# Patient Record
Sex: Female | Born: 1990 | Race: Black or African American | Hispanic: No | Marital: Single
Health system: Southern US, Community
[De-identification: ages and names within clinical notes are randomized; demographics above are authoritative.]

---

## 2011-11-17 ENCOUNTER — Emergency Department: Payer: Self-pay | Admitting: Emergency Medicine

## 2012-10-21 ENCOUNTER — Ambulatory Visit: Payer: Self-pay | Admitting: Obstetrics and Gynecology

## 2012-10-21 LAB — CBC
HCT: 36.3 % (ref 35.0–47.0)
HGB: 12.4 g/dL (ref 12.0–16.0)
MCH: 28.3 pg (ref 26.0–34.0)
MCHC: 34.2 g/dL (ref 32.0–36.0)
MCV: 83 fL (ref 80–100)
Platelet: 210 10*3/uL (ref 150–440)
RBC: 4.38 10*6/uL (ref 3.80–5.20)
RDW: 12.3 % (ref 11.5–14.5)
WBC: 15.7 10*3/uL — ABNORMAL HIGH (ref 3.6–11.0)

## 2012-10-21 LAB — BASIC METABOLIC PANEL
Anion Gap: 6 — ABNORMAL LOW (ref 7–16)
EGFR (African American): >60
Osmolality: 267 (ref 275–301)
Potassium: 3.5 mmol/L (ref 3.5–5.1)
Sodium: 135 mmol/L — ABNORMAL LOW (ref 136–145)

## 2012-10-21 LAB — HCG, QUANTITATIVE, PREGNANCY: Beta Hcg, Quant.: 44561 m[IU]/mL — ABNORMAL HIGH

## 2012-10-23 LAB — PATHOLOGY REPORT

## 2013-05-04 ENCOUNTER — Emergency Department: Payer: Self-pay | Admitting: Emergency Medicine

## 2013-05-04 LAB — CBC
HGB: 13.1 g/dL (ref 12.0–16.0)
MCH: 28.7 pg (ref 26.0–34.0)
MCV: 83 fL (ref 80–100)
RBC: 4.55 10*6/uL (ref 3.80–5.20)
WBC: 10.2 10*3/uL (ref 3.6–11.0)

## 2013-05-04 LAB — COMPREHENSIVE METABOLIC PANEL
Alkaline Phosphatase: 87 U/L (ref 50–136)
BUN: 14 mg/dL (ref 7–18)
Calcium, Total: 8.8 mg/dL (ref 8.5–10.1)
Creatinine: 0.96 mg/dL (ref 0.60–1.30)
EGFR (African American): >60
Glucose: 80 mg/dL (ref 65–99)
Potassium: 4.1 mmol/L (ref 3.5–5.1)
SGOT(AST): 18 U/L (ref 15–37)
SGPT (ALT): 19 U/L (ref 12–78)
Total Protein: 7.1 g/dL (ref 6.4–8.2)

## 2013-05-04 LAB — URINALYSIS, COMPLETE
Bacteria: NONE SEEN
Bilirubin,UR: NEGATIVE
Ketone: NEGATIVE
Leukocyte Esterase: NEGATIVE
Nitrite: NEGATIVE
RBC,UR: NONE SEEN /HPF (ref 0–5)
Specific Gravity: 1.016 (ref 1.003–1.030)
Squamous Epithelial: <1
WBC UR: NONE SEEN /HPF (ref 0–5)

## 2013-05-04 LAB — HCG, QUANTITATIVE, PREGNANCY: Beta Hcg, Quant.: 28874 m[IU]/mL — ABNORMAL HIGH

## 2013-08-19 ENCOUNTER — Observation Stay: Payer: Self-pay

## 2013-08-20 ENCOUNTER — Inpatient Hospital Stay: Payer: Self-pay | Admitting: Obstetrics and Gynecology

## 2013-08-20 LAB — COMPREHENSIVE METABOLIC PANEL
Albumin: 2.1 g/dL — ABNORMAL LOW (ref 3.4–5.0)
Alkaline Phosphatase: 58 U/L
Anion Gap: 9 (ref 7–16)
BUN: 4 mg/dL — AB (ref 7–18)
Bilirubin,Total: 0.1 mg/dL — ABNORMAL LOW (ref 0.2–1.0)
CREATININE: 0.47 mg/dL — AB (ref 0.60–1.30)
Calcium, Total: 7.9 mg/dL — ABNORMAL LOW (ref 8.5–10.1)
Chloride: 105 mmol/L (ref 98–107)
Co2: 22 mmol/L (ref 21–32)
EGFR (Non-African Amer.): >60
Glucose: 58 mg/dL — ABNORMAL LOW (ref 65–99)
OSMOLALITY: 267 (ref 275–301)
Potassium: 3.8 mmol/L (ref 3.5–5.1)
SGOT(AST): 15 U/L (ref 15–37)
SGPT (ALT): 13 U/L (ref 12–78)
SODIUM: 136 mmol/L (ref 136–145)
TOTAL PROTEIN: 4.7 g/dL — AB (ref 6.4–8.2)

## 2013-08-20 LAB — CBC
HCT: 24.4 % — AB (ref 35.0–47.0)
HGB: 8.4 g/dL — AB (ref 12.0–16.0)
MCH: 29 pg (ref 26.0–34.0)
MCHC: 34.5 g/dL (ref 32.0–36.0)
MCV: 84 fL (ref 80–100)
Platelet: 154 10*3/uL (ref 150–440)
RBC: 2.9 10*6/uL — ABNORMAL LOW (ref 3.80–5.20)
RDW: 13.6 % (ref 11.5–14.5)
WBC: 9.5 10*3/uL (ref 3.6–11.0)

## 2013-08-21 LAB — CBC WITH DIFFERENTIAL/PLATELET
BASOS ABS: 0 10*3/uL (ref 0.0–0.1)
Basophil %: 0.1 %
Eosinophil #: 0 10*3/uL (ref 0.0–0.7)
Eosinophil %: 0.1 %
HCT: 29.9 % — AB (ref 35.0–47.0)
HGB: 10.3 g/dL — ABNORMAL LOW (ref 12.0–16.0)
Lymphocyte #: 1.2 10*3/uL (ref 1.0–3.6)
Lymphocyte %: 6.5 %
MCH: 28.5 pg (ref 26.0–34.0)
MCHC: 34.5 g/dL (ref 32.0–36.0)
MCV: 83 fL (ref 80–100)
MONO ABS: 1.3 x10 3/mm — AB (ref 0.2–0.9)
Monocyte %: 7.2 %
NEUTROS ABS: 15.9 10*3/uL — AB (ref 1.4–6.5)
Neutrophil %: 86.1 %
Platelet: 195 10*3/uL (ref 150–440)
RBC: 3.61 10*6/uL — ABNORMAL LOW (ref 3.80–5.20)
RDW: 13.2 % (ref 11.5–14.5)
WBC: 18.5 10*3/uL — ABNORMAL HIGH (ref 3.6–11.0)

## 2013-08-22 ENCOUNTER — Ambulatory Visit (HOSPITAL_COMMUNITY)
Admission: AD | Admit: 2013-08-22 | Discharge: 2013-08-22 | Disposition: A | Discharge status: Home / Self Care | Payer: Medicaid Other | Source: Other Acute Inpatient Hospital | Attending: Obstetrics and Gynecology | Admitting: Obstetrics and Gynecology

## 2013-08-22 DIAGNOSIS — O2690 Pregnancy related conditions, unspecified, unspecified trimester: Secondary | ICD-10-CM | POA: Diagnosis present

## 2013-08-22 LAB — CBC WITH DIFFERENTIAL/PLATELET
BASOS PCT: 0.1 %
Basophil #: 0 10*3/uL (ref 0.0–0.1)
EOS ABS: 0 10*3/uL (ref 0.0–0.7)
Eosinophil %: 0.4 %
HCT: 27.4 % — ABNORMAL LOW (ref 35.0–47.0)
HGB: 9.5 g/dL — ABNORMAL LOW (ref 12.0–16.0)
LYMPHS ABS: 2 10*3/uL (ref 1.0–3.6)
Lymphocyte %: 15 %
MCH: 28.8 pg (ref 26.0–34.0)
MCHC: 34.5 g/dL (ref 32.0–36.0)
MCV: 83 fL (ref 80–100)
Monocyte #: 1.3 x10 3/mm — ABNORMAL HIGH (ref 0.2–0.9)
Monocyte %: 9.5 %
NEUTROS PCT: 75 %
Neutrophil #: 10.2 10*3/uL — ABNORMAL HIGH (ref 1.4–6.5)
PLATELETS: 178 10*3/uL (ref 150–440)
RBC: 3.29 10*6/uL — ABNORMAL LOW (ref 3.80–5.20)
RDW: 13.5 % (ref 11.5–14.5)
WBC: 13.6 10*3/uL — ABNORMAL HIGH (ref 3.6–11.0)

## 2013-08-22 LAB — MAGNESIUM: Magnesium: 4.8 mg/dL — ABNORMAL HIGH

## 2014-06-07 IMAGING — US US OB < 14 WEEKS - US OB TV
1 series · 13 of 25 positions shown · non-contrast
Comparison: none

REASON FOR EXAM: 7 weeks preg - bleeding, pain - eval for IUP
COMMENTS:

[Series 1: us ob < 14 weeks - us ob tv · 0.25mm/px · 13 of 25 slices shown]
[im 1/25]
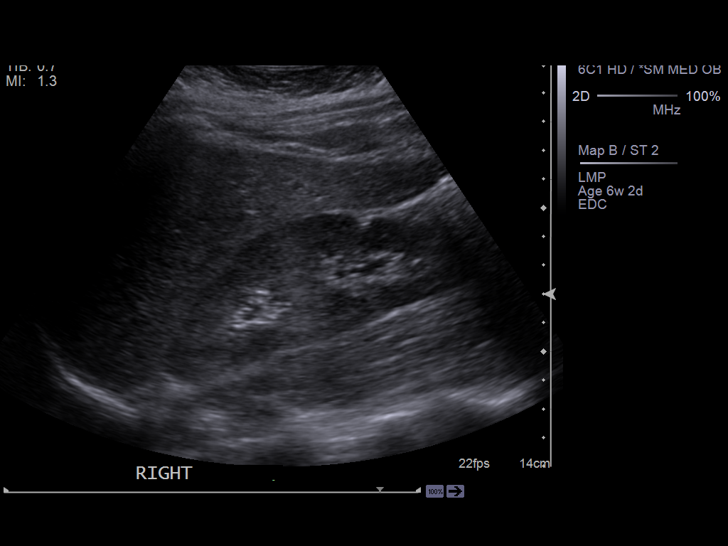
[im 3/25]
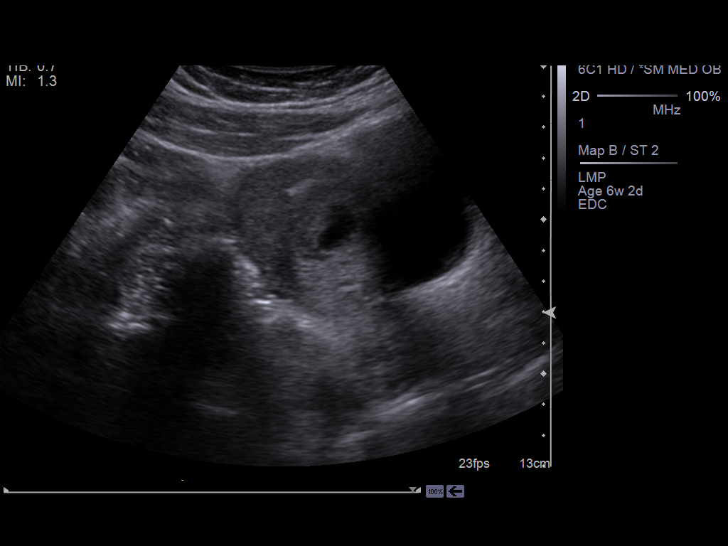
[im 5/25]
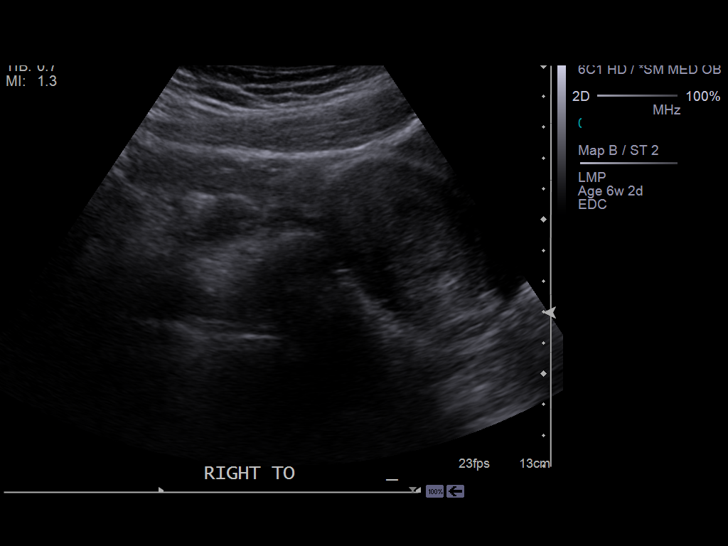
[im 7/25]
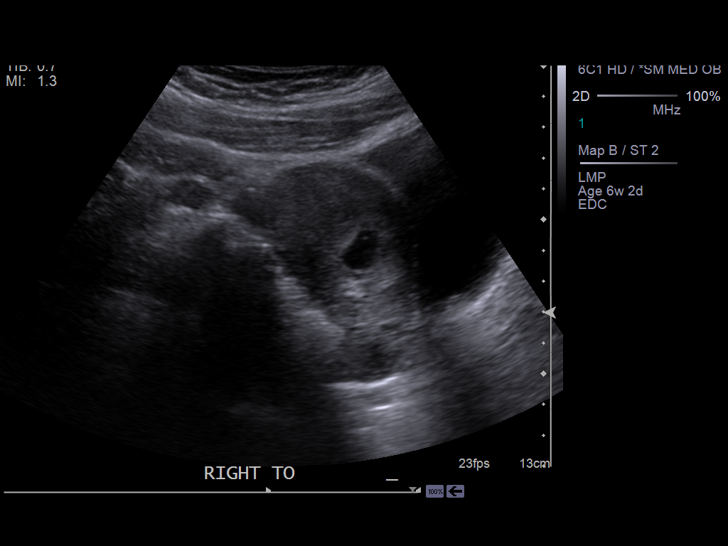
[im 9/25]
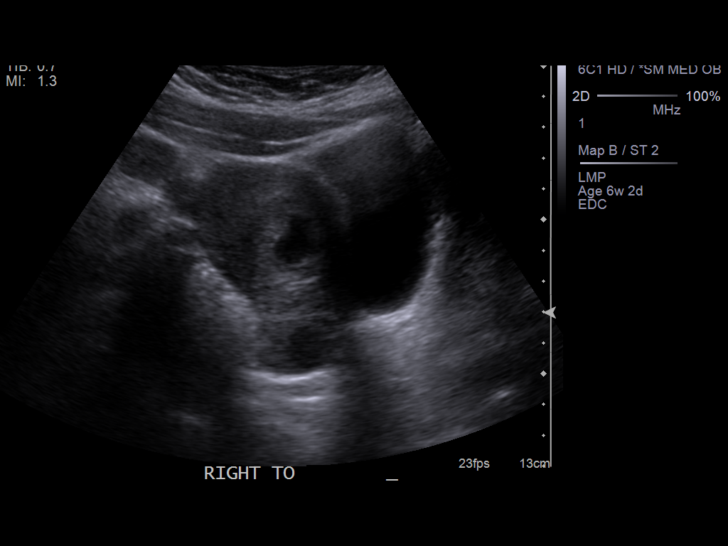
[im 11/25]
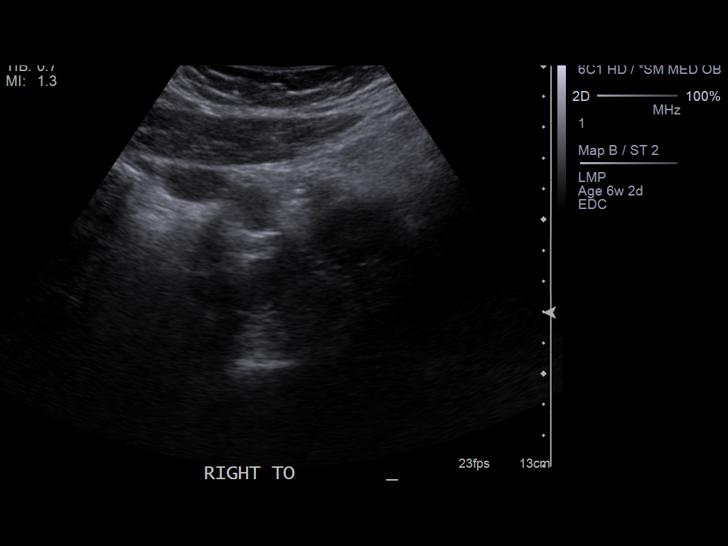
[im 13/25]
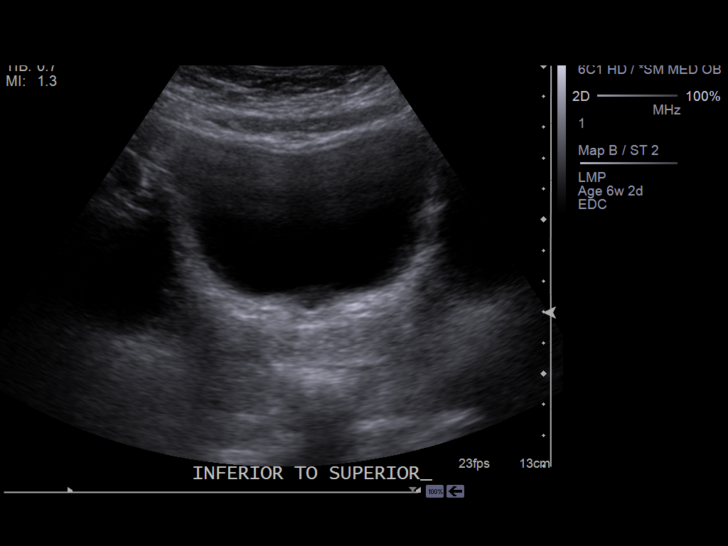
[im 15/25]
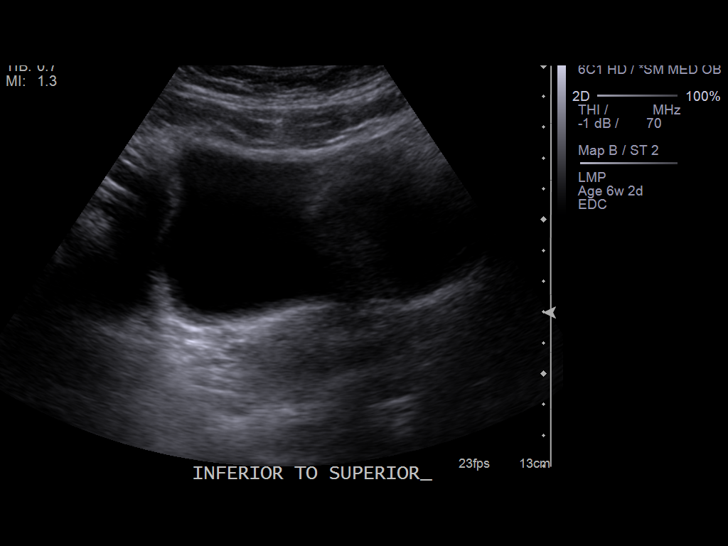
[im 17/25]
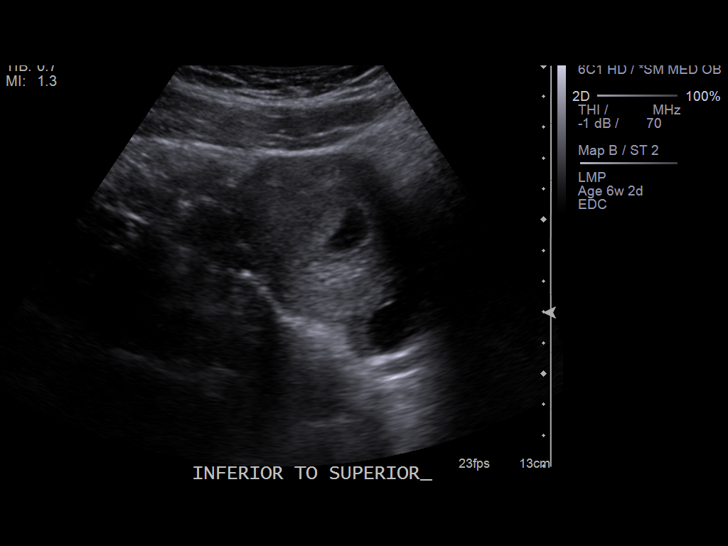
[im 19/25]
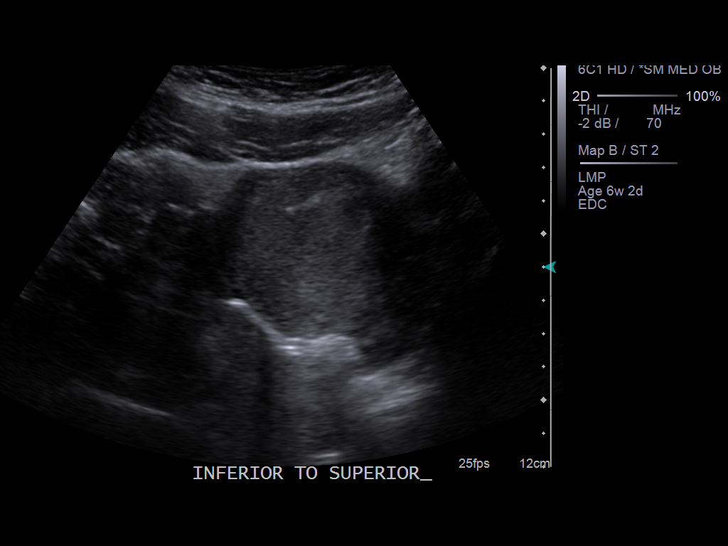
[im 21/25]
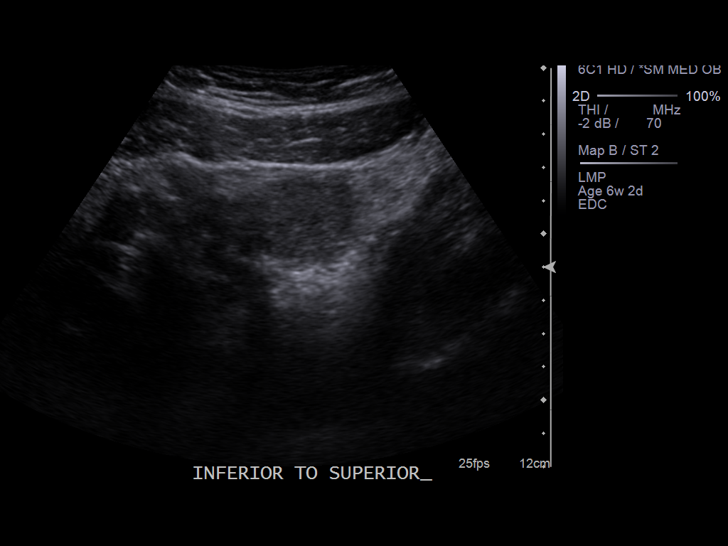
[im 23/25]
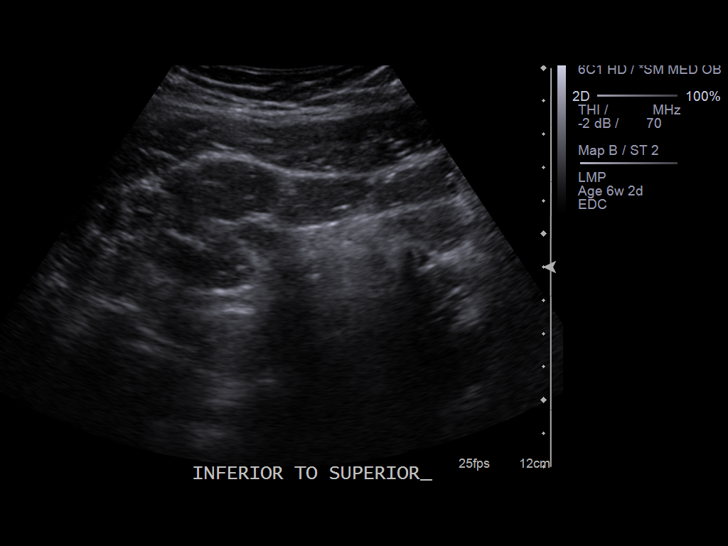
[im 25/25]
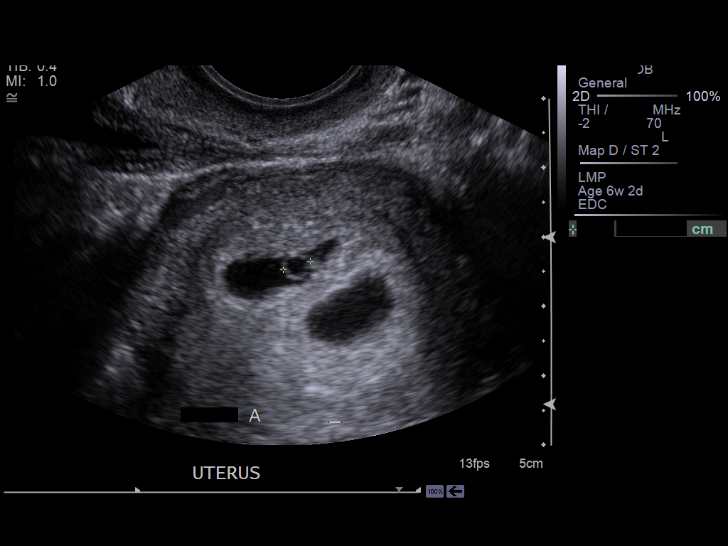

[13 of 25 positions shown; findings below may reference images not displayed]

PROCEDURE:     US  - US OB LESS THAN 14 WEEKS/W TRANS  - May 04, 2013 [DATE]

RESULT:     Transabdominal and endovaginal scanning is performed.
Endovaginal scanning is utilized for improved evaluation of the intrauterine
structures and ovaries.

There is a twin intrauterine gestation. Two gestational sacs and yolk sacs
are identified. Twin A is lower in the uterus on the longitudinal images
than twin B. Fetal cardiac activity is demonstrated in both Twins. There is
a cyst in the left ovary measuring 1.48 x 1.26 x 1.66 cm. This appears to
represent a simple cyst. Blood flow is documented in the left ovary.  The
right ovary is not demonstrated. Some images of the left ovary are
mislabeled as the right ovary. This was corrected. Gestational age is
calculated for twin A at 6 weeks one day and for twin B at 6 weeks 0 days
based on crown-rump length of 0.40 cm of twin A an 0.36 cm of twin B.
Cardiac activity is seen in both fetal poles with a rate of 121 beats per
minute in twin A and 122 beats per minute in twin B.
IMPRESSION: 1. Twin gestation as described. Fetal cardiac activity is seen in both
twins. The right ovary is not visualized. There is a cyst in the left ovary.
Blood flow is documented in the left ovary. No abnormal fluid collection is
seen.

[REDACTED]

## 2014-09-24 IMAGING — US US OB FOLLOW-UP
1 series · 13 of 28 positions shown · non-contrast
Comparison: none

CLINICAL DATA: Known twin gestational, the mother reports her water
broke on 20 August, 2013.

EXAM:
TWIN OBSTETRIC 14+ WK ULTRASOUND

[Series 1: us ob follow-up · 0.26mm/px · 13 of 52 slices shown]
[im 2/52]
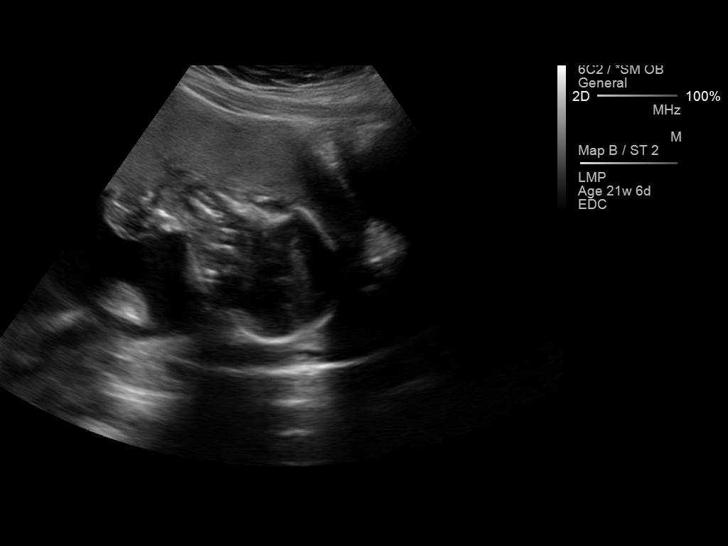
[im 6/52]
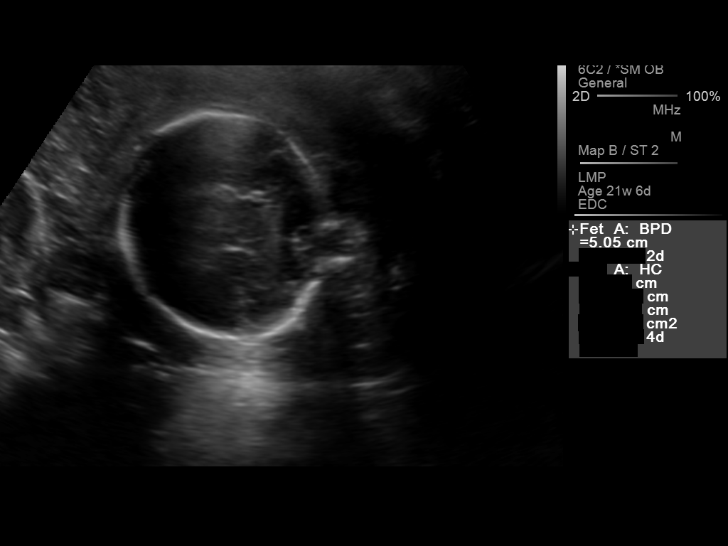
[im 10/52]
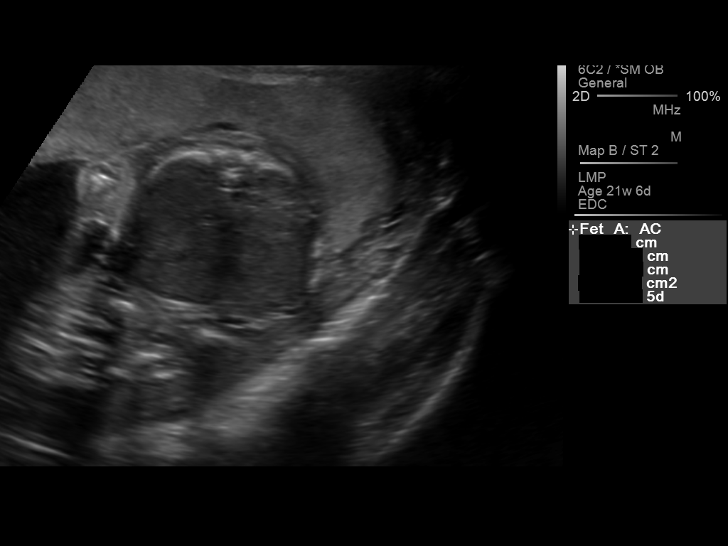
[im 14/52]
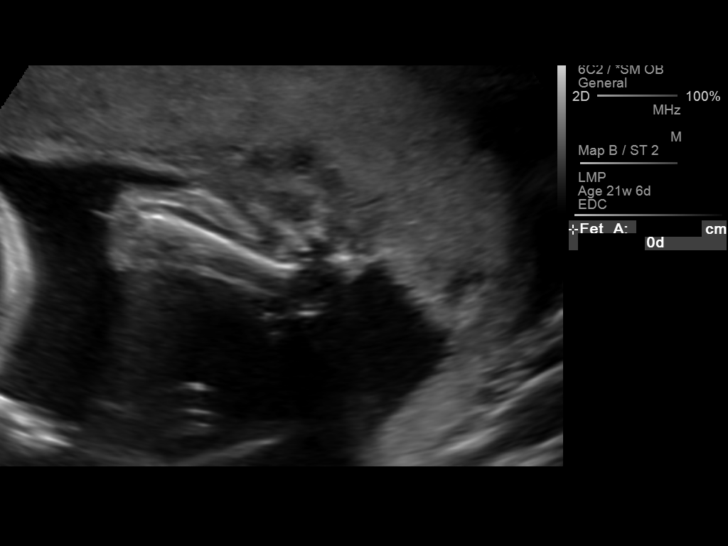
[im 18/52]
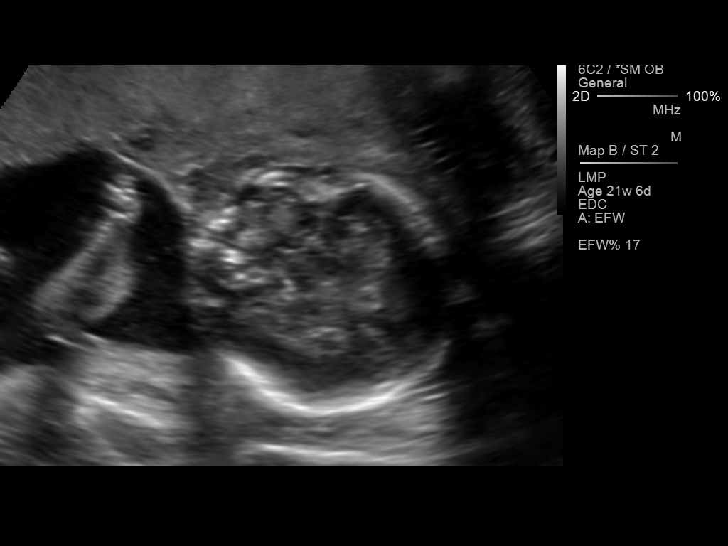
[im 21/52]
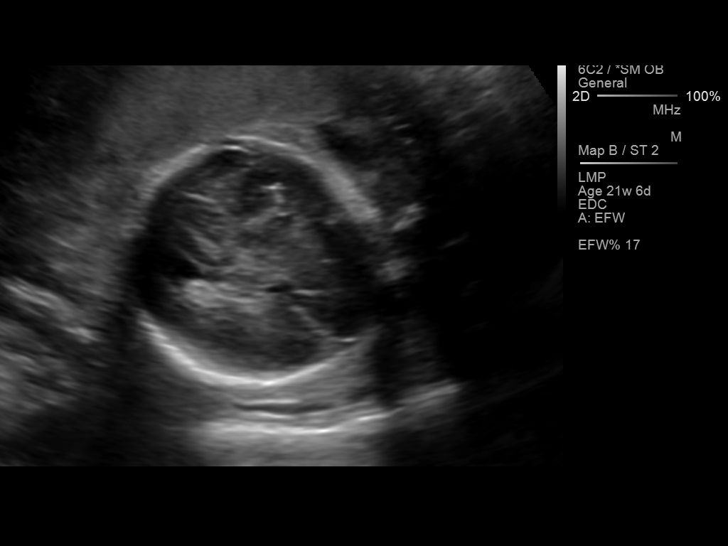
[im 27/52]
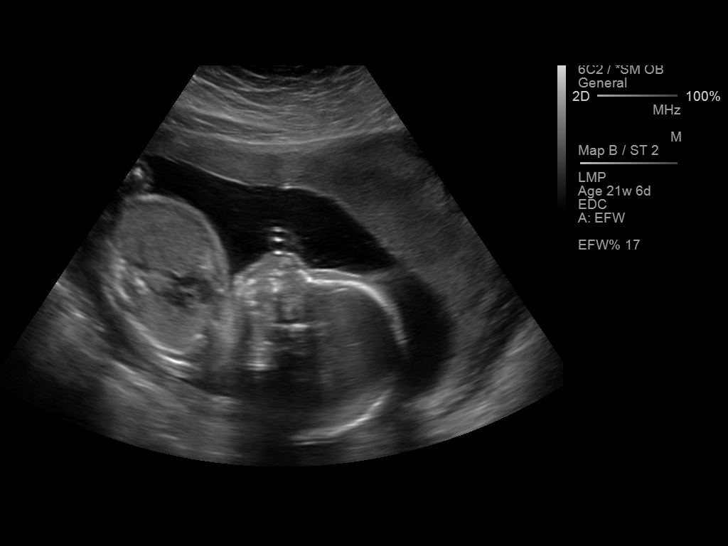
[im 31/52]
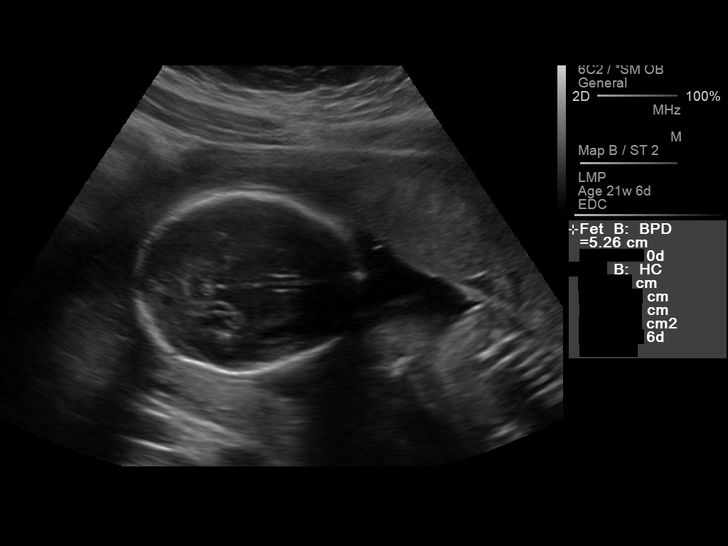
[im 35/52]
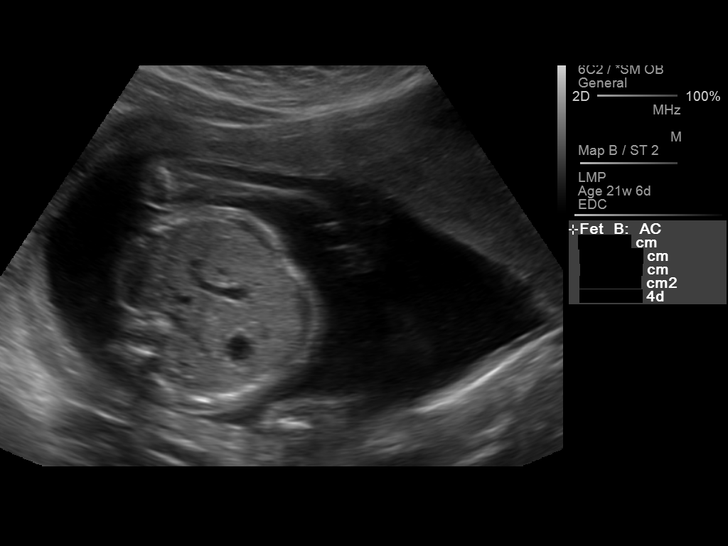
[im 38/52]
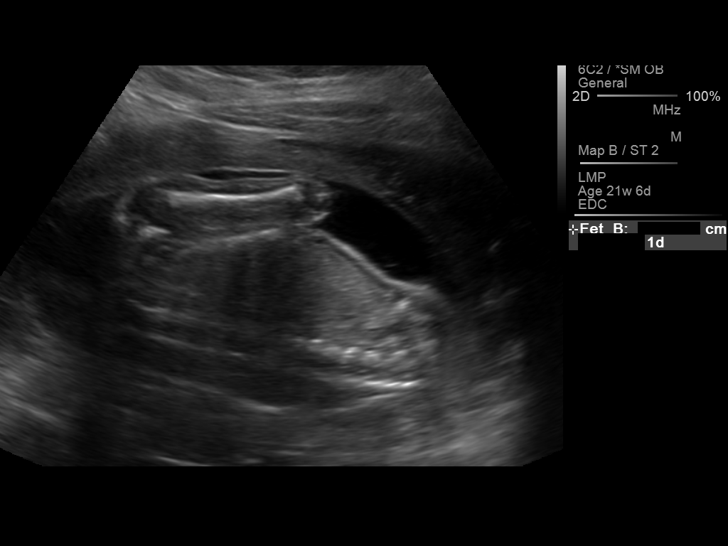
[im 42/52]
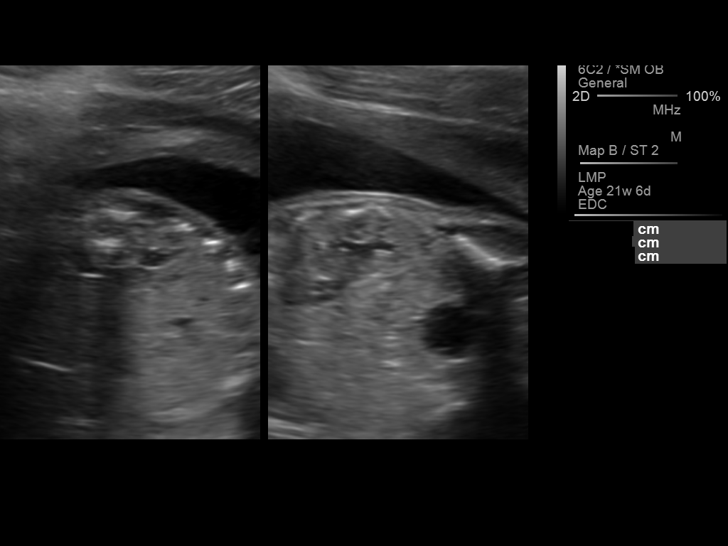
[im 46/52]
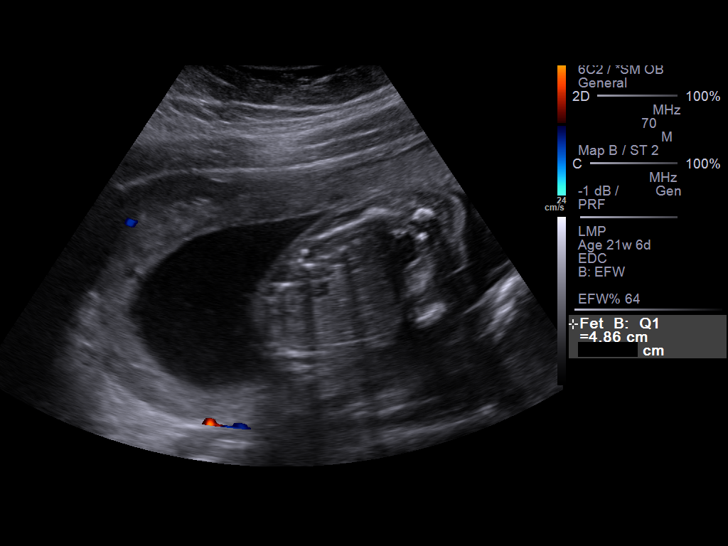
[im 50/52]
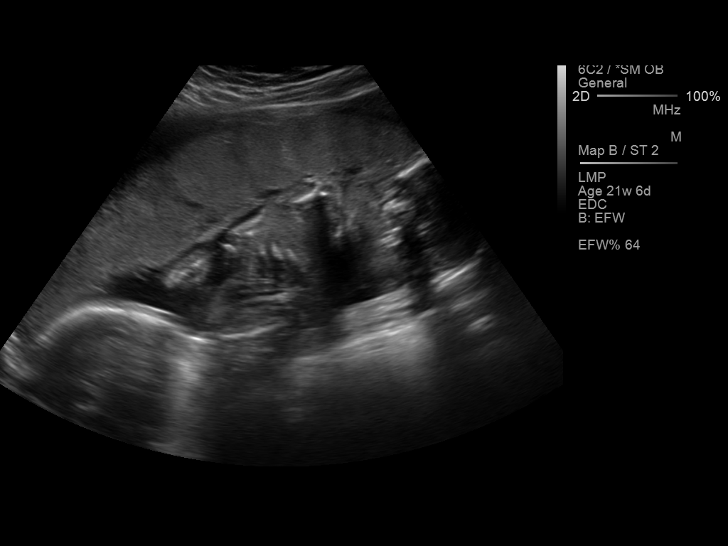

[13 of 28 positions shown; findings below may reference images not displayed]

FINDINGS: TWIN 1

Heart Rate:  144 bpm

Movement: Yes

Presentation: Cephalic with spine positioned anteriorly

Placental Location: Anterior with the lower placental margin 4.3 cm
from the internal cervical os

Previa: No

Amniotic Fluid (Subjective): None

Amniotic Fluid (Objective):  None measurable

FETAL BIOMETRY (TWIN 1)

BPD:  4.99cm 21 weeksw   1 dayd

HC:    18.23cm  20w   4d

AC:   15.93cm  21w   0d

FL:   3.65cm  21w   4d

Current Mean GA: 21 weeksw 0d US EDC: January 01, 2014

Assigned GA:  21 weeks 6d              Assigned EDC:  December 26, 2013

FETAL ANATOMY (TWIN 1)

Lateral Ventricles: Not visualized due to fetal position

Kidneys:  Normal in appearance

Bladder:  Normal in appearance

Technically difficult due to: Limited amount of amniotic fluid

TWIN 2

Heart Rate:  155 bpm

Movement: Gas

Presentation: Footling breech//spine presentation

Placental Location: The placenta is anterior with the lower
placental margin 4.3 cm from the internal cervical os

Previa: No

Amniotic Fluid (Subjective): Low

Amniotic Fluid (Objective):

Vertical Pocket 4.9 cm cm

FETAL BIOMETRY (TWIN 2)

BPD:  5.24cm 21w   6d

HC:    19.72cm  21w   6d

AC:   17.61cm  22w   4d

FL:   3.76cm  22w   0d

Current Mean GA: 22w 1d                       US EDC: December 24, 2013

Assigned GA:  21w 6d              Assigned EDC:  December 26, 2013.

FETAL ANATOMY (TWIN 2)

Lateral Ventricles: yes

Stomach on Left: Yes

Kidneys:  Normal

Bladder:  Normal

Technically difficult due to: Decreased volume of amniotic fluid
IMPRESSION: There is a viable twin gestation. Twin A demonstrates a cephalic
-spine anterior presentation but no adenoid fluid is demonstrated
surrounding it. Twin B demonstrates a breech presentation with only
a small amount of amniotic fluid demonstrated. There appears to been
appropriate interval growth for Twin B since the previous study but
Twin A has not demonstrated appropriate interval growth. The
estimated gestational age of Twin B is 22 weeks 1 day while that of
Twin A is 21 weeks 0 days.

## 2014-12-03 NOTE — Consult Note (Signed)
PATIENT NAME:  Sabrina Cobb, Sabrina M MR#:  [id number] DATE OF BIRTH:  12/31/1990  DATE OF CONSULTATION:  10/21/2012  ATTENDING PHYSICIAN:  Dr. Brien MatesBraud.  CONSULTING PHYSICIAN:  Suzy Bouchardhomas J. Schermerhorn, MD  HISTORY OF PRESENT ILLNESS: A 24 year old gravida 1, para 0. The patient's EDC of 04/25/2013, approximately [redacted] weeks gestational age, when she started having bleeding and cramping. The day of presenting to the Emergency Department, the patient expulsed what appeared to be approximately a 10-week size abortus. The patient is with continued bleeding.   PAST MEDICAL HISTORY: Unremarkable.   PAST SURGICAL HISTORY: Lymph node removal from left neck.   FAMILY HISTORY: No gynecologic cancers.   MEDICATIONS: Prenatal vitamins.   ALLERGIES: No known drug allergies.   SOCIAL HISTORY: She does not smoke, does not drink.   PHYSICAL EXAMINATION:  GENERAL: Well-developed, well-nourished black female.  VITAL SIGNS:, Temperature 98.5, blood pressure 133/72, pulse 105, respirations 20. LUNGS: Clear to auscultation.  CARDIOVASCULAR: Regular rate and rhythm.  BREASTS: Breast exam not performed.  ABDOMEN: Soft, slight tenderness suprapubically.  PELVIC: External genitalia, blood on the perineum. Abortus still attached to umbilical cord, approximately 10 weeks in size. This was removed. In the vagina, a 10 mL clot with placental unit in the vault. Cervix dilated with brisk bleeding. Uterus approximately 10 weeks in size. Adnexa, no mass, nontender.  RECTOVAGINAL EXAM: Deferred.   ASSESSMENT: Incomplete abortion.   PLANS: Suction, dilation and curettage in the main OR. The patient last ate or drank yesterday. Rh status pending. Intramuscular Methergine 0.2 mg  administered to the patient before moving to the OR.    ____________________________ Suzy Bouchardhomas J. Schermerhorn, MD tjs:lo D: 10/21/2012 11:15:02 ET T: 10/21/2012 11:43:50 ET JOB#: [id number]  cc: Suzy Bouchardhomas J. Schermerhorn, MD, <Dictator> Suzy BouchardHOMAS J  SCHERMERHORN MD ELECTRONICALLY SIGNED 11/03/2012 8:37

## 2014-12-03 NOTE — Op Note (Signed)
PATIENT NAME:  Sabrina Cobb, Sabrina M MR#:  [id number] DATE OF BIRTH:  Jan 06, 1991  DATE OF PROCEDURE:  10/21/2012  PREOPERATIVE DIAGNOSIS: Incomplete abortion.   POSTOPERATIVE DIAGNOSIS: Incomplete abortion.  PROCEDURE: Suction dilation and curettage.   ANESTHESIA: LMA.  SURGEON: Dr. Feliberto GottronSchermerhorn.  INDICATION: This is a 24 year old gravida 1, para 0 patient at 10 weeks with heavy bleeding and passage of tissue. Rh status 0-.   PROCEDURE: After adequate LMA anesthesia, the patient was placed in the dorsal supine position with legs in the candycane stirrups. The patient's abdomen was prepped and draped in normal sterile fashion. A red Robinson catheter was used to drain the bladder and yielded 100 mL of clear urine. A large amount of clot evacuated from the vaginal vault. A weighted speculum was placed in the posterior vaginal vault and the anterior cervix was grasped with a single-tooth tenaculum. The cervix was dilated to #20 (Dictation Anomaly) <<hanks>> dilator without difficulty. A #8 flexible suction curette placed in the endometrial cavity. Large amount of clot and tissue consistent with products of conception removed. Sharp curettage performed with good uterine cry throughout. A repeat suction curettage with no additional tissue, but some intermittent brisk bleeding. Direct manual compression performed. The patient did receive 0.2 mg IM Methergine in the operating room and 250 mcg intramuscular of Hemabate intraoperatively. At the end of the procedure, good hemostasis was noted. No complications. Estimated blood loss 100 mL. Intraoperative fluids were 200 mL. Urine output 100 mL. The patient was taken to the recovery room in good condition.   ____________________________ Suzy Bouchardhomas J. Schermerhorn, MD tjs:aw D: 10/21/2012 12:31:19 ET T: 10/21/2012 13:29:43 ET JOB#: [id number]  cc: Suzy Bouchardhomas J. Schermerhorn, MD, <Dictator> Suzy BouchardHOMAS J SCHERMERHORN MD ELECTRONICALLY SIGNED 11/03/2012 8:37

## 2014-12-03 NOTE — H&P (Signed)
PATIENT NAME:  Sabrina Cobb, Sabrina Cobb MR#:  [id number] DATE OF BIRTH:  08-19-1990  DATE OF ADMISSION:  10/21/2012  HISTORY OF PRESENT ILLNESS: A 24 year old gravida 1 para 0 at 2610 weeks' estimated gestational age. Patient with significant bleeding and cramping, presented to the Emergency Department today. Blood type is O-.   PAST MEDICAL HISTORY: Unremarkable.   PAST SURGICAL HISTORY: Lymph node removal from neck.   REVIEW OF SYSTEMS: Unremarkable.   FAMILY HISTORY: No gynecologic cancers.   MEDICATIONS:  Prenatal vitamins.   ALLERGIES: No known drug allergies.   SOCIAL HISTORY: She does not smoke. She does not drink.   PHYSICAL EXAMINATION: GENERAL: Well-developed, well-nourished black female.  VITAL SIGNS: Blood pressure 133/72, pulse 105.  LUNGS: Clear to auscultation.  CARDIOVASCULAR: Regular rate and rhythm.  ABDOMEN: Soft. Slight tenderness to palpation, suprapubic.  PELVIC: Large amount of blood at the perineal area. Vagina with 10 mL clotted and nonclotted blood. Abortus at the introitus, and placental unit in the vaginal vault; both were removed. Cervix was open, active bleeding. Uterus 10 weeks in size. Adnexa: No mass, nontender. RECTOVAGINAL EXAM: Is deferred.   ASSESSMENT: Incomplete abortion.   PLANS:  1.  Suction dilation and curettage.  2. Methergine 0.2 mg intramuscular administered.  3.  Rhophylac will be administered post procedure given Rh status.    ____________________________ Suzy Bouchardhomas J. Schermerhorn, MD tjs:dm D: 10/21/2012 11:45:00 ET T: 10/21/2012 12:21:09 ET JOB#: [id number]  cc: Suzy Bouchardhomas J. Schermerhorn, MD, <Dictator> Suzy BouchardHOMAS J SCHERMERHORN MD ELECTRONICALLY SIGNED 11/03/2012 8:37

## 2014-12-21 NOTE — H&P (Signed)
L&D Evaluation:  History:  HPI 24y/o BF 6829w1d with twin gestation Presents with gush of fluid after intercourse that progressed to vigorous dark bleeding Pt. denies pain   Presents with vaginal bleeding, LOF   Patient's Medical History No Chronic Illness   Patient's Surgical History D&C after 12 week SAb   Medications Pre Natal Vitamins  Iron   Social History none   Family History Non-Contributory   ROS:  ROS All systems were reviewed.  HEENT, CNS, GI, GU, Respiratory, CV, Renal and Musculoskeletal systems were found to be normal., except as above   Exam:  Vital Signs stable  afeb   Urine Protein not completed   General no apparent distress   Mental Status clear   Abdomen gravid, non-tender   Estimated Fetal Weight Average for gestational age   Back no CVAT   Reflexes 1+   Clonus negative   Pelvic dilated with palpable cord in vagina   Mebranes Ruptured   Description bloody   FHT +rate x 2   Ucx irregular   Skin dry   Other U/S shows no fluid around "A"; grossly nml fluid at "B"   Impression:  Impression PPROM, with prolapsed cord of A   Plan:  Comments Monitor for s/sx chorio Likely she will lose both Will ask Neo to coundel pt on odds (if any) of twin B surviving   Electronic Signatures: Margaretha GlassingEvans, Ricky L (MD)  (Signed 08-Jan-15 17:58)  Authored: L&D Evaluation   Last Updated: 08-Jan-15 17:58 by Margaretha GlassingEvans, Ricky L (MD)

## 2019-07-02 DIAGNOSIS — E669 Obesity, unspecified: Secondary | ICD-10-CM | POA: Diagnosis not present

## 2019-07-02 DIAGNOSIS — O3680X0 Pregnancy with inconclusive fetal viability, not applicable or unspecified: Secondary | ICD-10-CM | POA: Diagnosis not present

## 2019-07-02 DIAGNOSIS — Z6841 Body Mass Index (BMI) 40.0 and over, adult: Secondary | ICD-10-CM | POA: Diagnosis not present

## 2019-07-02 DIAGNOSIS — Z3A11 11 weeks gestation of pregnancy: Secondary | ICD-10-CM | POA: Diagnosis not present

## 2019-07-02 DIAGNOSIS — Z1379 Encounter for other screening for genetic and chromosomal anomalies: Secondary | ICD-10-CM | POA: Diagnosis not present

## 2019-07-02 DIAGNOSIS — O99211 Obesity complicating pregnancy, first trimester: Secondary | ICD-10-CM | POA: Diagnosis not present

## 2019-08-03 DIAGNOSIS — O99212 Obesity complicating pregnancy, second trimester: Secondary | ICD-10-CM | POA: Diagnosis not present

## 2019-08-03 DIAGNOSIS — O99211 Obesity complicating pregnancy, first trimester: Secondary | ICD-10-CM | POA: Diagnosis not present

## 2019-08-13 DIAGNOSIS — Z8751 Personal history of pre-term labor: Secondary | ICD-10-CM | POA: Diagnosis not present

## 2019-08-20 DIAGNOSIS — Z315 Encounter for genetic counseling: Secondary | ICD-10-CM | POA: Diagnosis not present

## 2019-08-27 DIAGNOSIS — Z3482 Encounter for supervision of other normal pregnancy, second trimester: Secondary | ICD-10-CM | POA: Diagnosis not present

## 2019-08-27 DIAGNOSIS — Z3A19 19 weeks gestation of pregnancy: Secondary | ICD-10-CM | POA: Diagnosis not present

## 2019-08-27 DIAGNOSIS — E668 Other obesity: Secondary | ICD-10-CM | POA: Diagnosis not present

## 2019-08-27 DIAGNOSIS — Z363 Encounter for antenatal screening for malformations: Secondary | ICD-10-CM | POA: Diagnosis not present

## 2019-08-27 DIAGNOSIS — O99212 Obesity complicating pregnancy, second trimester: Secondary | ICD-10-CM | POA: Diagnosis not present

## 2019-08-27 DIAGNOSIS — Z6841 Body Mass Index (BMI) 40.0 and over, adult: Secondary | ICD-10-CM | POA: Diagnosis not present

## 2019-09-03 DIAGNOSIS — Z8751 Personal history of pre-term labor: Secondary | ICD-10-CM | POA: Diagnosis not present

## 2019-09-10 DIAGNOSIS — Z8751 Personal history of pre-term labor: Secondary | ICD-10-CM | POA: Diagnosis not present

## 2019-09-17 DIAGNOSIS — Z8751 Personal history of pre-term labor: Secondary | ICD-10-CM | POA: Diagnosis not present

## 2019-09-24 DIAGNOSIS — Z8751 Personal history of pre-term labor: Secondary | ICD-10-CM | POA: Diagnosis not present

## 2019-09-29 DIAGNOSIS — O99212 Obesity complicating pregnancy, second trimester: Secondary | ICD-10-CM | POA: Diagnosis not present

## 2019-09-29 DIAGNOSIS — Z3A23 23 weeks gestation of pregnancy: Secondary | ICD-10-CM | POA: Diagnosis not present

## 2019-09-29 DIAGNOSIS — Z6841 Body Mass Index (BMI) 40.0 and over, adult: Secondary | ICD-10-CM | POA: Diagnosis not present

## 2019-09-29 DIAGNOSIS — E668 Other obesity: Secondary | ICD-10-CM | POA: Diagnosis not present

## 2019-09-29 DIAGNOSIS — Z362 Encounter for other antenatal screening follow-up: Secondary | ICD-10-CM | POA: Diagnosis not present

## 2019-10-01 DIAGNOSIS — O09213 Supervision of pregnancy with history of pre-term labor, third trimester: Secondary | ICD-10-CM | POA: Diagnosis not present

## 2019-10-01 DIAGNOSIS — O99212 Obesity complicating pregnancy, second trimester: Secondary | ICD-10-CM | POA: Diagnosis not present

## 2019-10-01 DIAGNOSIS — Z3A24 24 weeks gestation of pregnancy: Secondary | ICD-10-CM | POA: Diagnosis not present

## 2019-10-01 DIAGNOSIS — E669 Obesity, unspecified: Secondary | ICD-10-CM | POA: Diagnosis not present

## 2019-10-01 DIAGNOSIS — Z8751 Personal history of pre-term labor: Secondary | ICD-10-CM | POA: Diagnosis not present

## 2019-10-08 DIAGNOSIS — Z8751 Personal history of pre-term labor: Secondary | ICD-10-CM | POA: Diagnosis not present

## 2019-10-15 DIAGNOSIS — Z8751 Personal history of pre-term labor: Secondary | ICD-10-CM | POA: Diagnosis not present

## 2019-10-22 DIAGNOSIS — Z3482 Encounter for supervision of other normal pregnancy, second trimester: Secondary | ICD-10-CM | POA: Diagnosis not present

## 2019-10-22 DIAGNOSIS — O99212 Obesity complicating pregnancy, second trimester: Secondary | ICD-10-CM | POA: Diagnosis not present

## 2019-10-22 DIAGNOSIS — Z23 Encounter for immunization: Secondary | ICD-10-CM | POA: Diagnosis not present

## 2019-10-28 ENCOUNTER — Ambulatory Visit: Payer: Self-pay | Admitting: Women's Health

## 2019-10-29 DIAGNOSIS — Z8751 Personal history of pre-term labor: Secondary | ICD-10-CM | POA: Diagnosis not present

## 2019-11-05 DIAGNOSIS — Z8751 Personal history of pre-term labor: Secondary | ICD-10-CM | POA: Diagnosis not present

## 2019-11-12 DIAGNOSIS — O09213 Supervision of pregnancy with history of pre-term labor, third trimester: Secondary | ICD-10-CM | POA: Diagnosis not present

## 2019-11-12 DIAGNOSIS — Z8751 Personal history of pre-term labor: Secondary | ICD-10-CM | POA: Diagnosis not present

## 2019-11-18 DIAGNOSIS — Z3A30 30 weeks gestation of pregnancy: Secondary | ICD-10-CM | POA: Diagnosis not present

## 2019-11-18 DIAGNOSIS — E668 Other obesity: Secondary | ICD-10-CM | POA: Diagnosis not present

## 2019-11-18 DIAGNOSIS — O99213 Obesity complicating pregnancy, third trimester: Secondary | ICD-10-CM | POA: Diagnosis not present

## 2019-11-18 DIAGNOSIS — Z362 Encounter for other antenatal screening follow-up: Secondary | ICD-10-CM | POA: Diagnosis not present

## 2019-11-20 DIAGNOSIS — Z8751 Personal history of pre-term labor: Secondary | ICD-10-CM | POA: Diagnosis not present

## 2019-11-27 DIAGNOSIS — Z8751 Personal history of pre-term labor: Secondary | ICD-10-CM | POA: Diagnosis not present

## 2019-12-11 DIAGNOSIS — Z8751 Personal history of pre-term labor: Secondary | ICD-10-CM | POA: Diagnosis not present

## 2019-12-18 DIAGNOSIS — O99212 Obesity complicating pregnancy, second trimester: Secondary | ICD-10-CM | POA: Diagnosis not present

## 2019-12-23 DIAGNOSIS — O99213 Obesity complicating pregnancy, third trimester: Secondary | ICD-10-CM | POA: Diagnosis not present

## 2019-12-23 DIAGNOSIS — Z3A35 35 weeks gestation of pregnancy: Secondary | ICD-10-CM | POA: Diagnosis not present

## 2019-12-23 DIAGNOSIS — E668 Other obesity: Secondary | ICD-10-CM | POA: Diagnosis not present

## 2019-12-23 DIAGNOSIS — Z3483 Encounter for supervision of other normal pregnancy, third trimester: Secondary | ICD-10-CM | POA: Diagnosis not present

## 2020-01-01 DIAGNOSIS — Z3483 Encounter for supervision of other normal pregnancy, third trimester: Secondary | ICD-10-CM | POA: Diagnosis not present

## 2020-01-01 DIAGNOSIS — O99212 Obesity complicating pregnancy, second trimester: Secondary | ICD-10-CM | POA: Diagnosis not present

## 2020-01-12 DIAGNOSIS — E668 Other obesity: Secondary | ICD-10-CM | POA: Diagnosis not present

## 2020-01-12 DIAGNOSIS — Z3A38 38 weeks gestation of pregnancy: Secondary | ICD-10-CM | POA: Diagnosis not present

## 2020-01-12 DIAGNOSIS — Z8751 Personal history of pre-term labor: Secondary | ICD-10-CM | POA: Diagnosis not present

## 2020-01-12 DIAGNOSIS — O99214 Obesity complicating childbirth: Secondary | ICD-10-CM | POA: Diagnosis not present

## 2020-01-26 DIAGNOSIS — Z8751 Personal history of pre-term labor: Secondary | ICD-10-CM | POA: Diagnosis not present

## 2020-02-24 DIAGNOSIS — Z30013 Encounter for initial prescription of injectable contraceptive: Secondary | ICD-10-CM | POA: Diagnosis not present

## 2020-02-24 DIAGNOSIS — Z1332 Encounter for screening for maternal depression: Secondary | ICD-10-CM | POA: Diagnosis not present

## 2020-05-13 DIAGNOSIS — Z309 Encounter for contraceptive management, unspecified: Secondary | ICD-10-CM | POA: Diagnosis not present

## 2020-08-03 DIAGNOSIS — Z309 Encounter for contraceptive management, unspecified: Secondary | ICD-10-CM | POA: Diagnosis not present

## 2020-10-19 DIAGNOSIS — Z309 Encounter for contraceptive management, unspecified: Secondary | ICD-10-CM | POA: Diagnosis not present

## 2021-01-04 DIAGNOSIS — Z308 Encounter for other contraceptive management: Secondary | ICD-10-CM | POA: Diagnosis not present

## 2021-03-23 DIAGNOSIS — Z3042 Encounter for surveillance of injectable contraceptive: Secondary | ICD-10-CM | POA: Diagnosis not present

## 2021-03-23 DIAGNOSIS — Z113 Encounter for screening for infections with a predominantly sexual mode of transmission: Secondary | ICD-10-CM | POA: Diagnosis not present

## 2021-03-23 DIAGNOSIS — Z23 Encounter for immunization: Secondary | ICD-10-CM | POA: Diagnosis not present

## 2021-03-23 DIAGNOSIS — Z0000 Encounter for general adult medical examination without abnormal findings: Secondary | ICD-10-CM | POA: Diagnosis not present

## 2021-06-08 DIAGNOSIS — Z23 Encounter for immunization: Secondary | ICD-10-CM | POA: Diagnosis not present

## 2021-06-08 DIAGNOSIS — Z3042 Encounter for surveillance of injectable contraceptive: Secondary | ICD-10-CM | POA: Diagnosis not present

## 2022-01-03 DIAGNOSIS — M25561 Pain in right knee: Secondary | ICD-10-CM | POA: Diagnosis not present

## 2023-10-19 DIAGNOSIS — J101 Influenza due to other identified influenza virus with other respiratory manifestations: Secondary | ICD-10-CM | POA: Diagnosis not present
